# Patient Record
Sex: Female | Born: 2000 | Race: Black or African American | Hispanic: No | Marital: Single | Smoking: Former smoker
Health system: Southern US, Community
[De-identification: ages and names within clinical notes are randomized; demographics above are authoritative.]

## PROBLEM LIST (undated history)

## (undated) DIAGNOSIS — I4581 Long QT syndrome: Secondary | ICD-10-CM

---
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
## 2022-01-09 ENCOUNTER — Ambulatory Visit: Payer: Medicaid Other

## 2022-03-20 ENCOUNTER — Ambulatory Visit: Payer: Medicaid Other

## 2022-03-22 ENCOUNTER — Ambulatory Visit: Payer: Medicaid Other

## 2022-06-05 ENCOUNTER — Other Ambulatory Visit: Payer: Self-pay

## 2022-06-05 ENCOUNTER — Encounter: Payer: Self-pay | Admitting: *Deleted

## 2022-06-05 ENCOUNTER — Emergency Department
Admission: EM | Admit: 2022-06-05 | Discharge: 2022-06-05 | Disposition: A | Discharge status: Home / Self Care | Payer: BC Managed Care – PPO | Attending: Emergency Medicine | Admitting: Emergency Medicine

## 2022-06-05 DIAGNOSIS — R001 Bradycardia, unspecified: Secondary | ICD-10-CM | POA: Insufficient documentation

## 2022-06-05 DIAGNOSIS — R519 Headache, unspecified: Secondary | ICD-10-CM | POA: Insufficient documentation

## 2022-06-05 DIAGNOSIS — Y92410 Unspecified street and highway as the place of occurrence of the external cause: Secondary | ICD-10-CM | POA: Insufficient documentation

## 2022-06-05 DIAGNOSIS — Z950 Presence of cardiac pacemaker: Secondary | ICD-10-CM | POA: Insufficient documentation

## 2022-06-05 MED ORDER — BUTALBITAL-APAP-CAFFEINE 50-325-40 MG PO TABS
2.0000 | ORAL_TABLET | Freq: Once | ORAL | Status: AC
  Administered 2022-06-05: 2 via ORAL
  Filled 2022-06-05: qty 2
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
## 2022-07-18 ENCOUNTER — Ambulatory Visit: Payer: Medicaid Other

## 2023-04-18 ENCOUNTER — Other Ambulatory Visit: Payer: Self-pay

## 2023-04-18 ENCOUNTER — Emergency Department (HOSPITAL_COMMUNITY)
Admission: EM | Admit: 2023-04-18 | Discharge: 2023-04-18 | Disposition: A | Discharge status: Home / Self Care | Payer: Medicaid Other | Attending: Emergency Medicine | Admitting: Emergency Medicine

## 2023-04-18 ENCOUNTER — Encounter (HOSPITAL_COMMUNITY): Payer: Self-pay

## 2023-04-18 DIAGNOSIS — K029 Dental caries, unspecified: Secondary | ICD-10-CM | POA: Diagnosis not present

## 2023-04-18 DIAGNOSIS — Z950 Presence of cardiac pacemaker: Secondary | ICD-10-CM | POA: Insufficient documentation

## 2023-04-18 DIAGNOSIS — K0889 Other specified disorders of teeth and supporting structures: Secondary | ICD-10-CM | POA: Diagnosis present

## 2023-04-18 MED ORDER — OXYCODONE-ACETAMINOPHEN 5-325 MG PO TABS: 1.0000 | ORAL_TABLET | Freq: Four times a day (QID) | ORAL | 0 refills | Status: DC | PRN

## 2023-04-18 NOTE — ED Triage Notes (Signed)
Pt to ED with complaint of right upper dental pain x1 wk. Denies fevers.

## 2023-04-18 NOTE — Discharge Instructions (Signed)
You were seen in the emergency department for dental pain.  Based on my examination, it appears that you have dental caries that are causing her pain.  I would encourage you to follow-up with a dentist for further evaluation and treatment for this.  I have sent a prescription for some pain medication he can take for more severe breakthrough pain.  Otherwise I advised that he take Tylenol, ibuprofen, Aleve.  You feel your condition is worsening or begin to experience any notable swelling or redness around please return to the emergency department for further evaluation.

## 2023-04-18 NOTE — ED Provider Notes (Signed)
EMERGENCY DEPARTMENT AT [facility] Provider Note   CSN: [id number] Arrival date & time: 04/18/23  1819     History Chief Complaint  Patient presents with   Dental Pain    Marcia Carpenter is a 22 y.o. female.  Patient presents emergency department complaints of dental pain.  She reports that she has been experiencing right lower side dental pain.  Has not been seen by dentist recently.  Reports that she has a pacemaker and this makes it difficult to be seen by dentist for treatment.  Denies any difficulty eating or swallowing due to pain.   Dental Pain      Home Medications Prior to Admission medications   Medication Sig Start Date End Date Taking? Authorizing Provider  oxyCODONE-acetaminophen (PERCOCET/ROXICET) 5-325 MG tablet Take 1 tablet by mouth every 6 (six) hours as needed for severe pain. 04/18/23  Yes Smitty Knudsen, PA-C  clindamycin-benzoyl peroxide (BENZACLIN) gel Apply topically daily. Patient not taking: Reported on 04/23/2021 04/03/21   [provider]  DEPO-PROVERA 150 MG/ML injection Inject 150 mg into the muscle every 3 (three) months. Patient not taking: Reported on 04/23/2021 03/28/21   [provider]  Multiple Vitamins-Minerals (ONE DAILY FOR WOMEN) TABS Take 1 tablet by mouth daily. Patient not taking: Reported on 04/23/2021 03/28/21   [provider]  nadolol (CORGARD) 80 MG tablet Take 80 mg by mouth daily.    [provider]      Allergies    Other    Review of Systems   Review of Systems  HENT:  Positive for dental problem.   All other systems reviewed and are negative.   Physical Exam Updated Vital Signs BP 111/88 (BP Location: Right Arm)   Pulse 82   Temp 98.3 F (36.8 C) (Oral)   Resp 16   LMP 04/03/2023 (Approximate)   SpO2 100%  Physical Exam Vitals and nursing note reviewed.  HENT:     Head: Normocephalic and atraumatic.     Mouth/Throat:     Mouth: Mucous membranes are moist.      Comments: Dental caries noted on 31. No abscess. Eyes:     General: No scleral icterus.       Right eye: No discharge.        Left eye: No discharge.  Cardiovascular:     Rate and Rhythm: Normal rate and regular rhythm.  Skin:    Findings: No rash.     ED Results / Procedures / Treatments   Labs (all labs ordered are listed, but only abnormal results are displayed) Labs Reviewed - No data to display  EKG None  Radiology No results found.  Procedures Procedures   Medications Ordered in ED Medications - No data to display  ED Course/ Medical Decision Making/ A&P                           Medical Decision Making  Problem List / ED Course:  Patient presents emergency department complaints of dental pain.  She reports that this pain has been present for several days and has been worsening.  Reports that she has a pacemaker and has not been able to be seen by a dentist because of difficulty getting seen by dentist will that we will treat her with a pacemaker in place.  States that she has been trying over-the-counter pain medications without significant relief in symptoms.  On examination, patient does appear to  have dental caries and lower molars numbers 31 and possibly 18.  Advised patient that she needs to be seen by dentist for further evaluation and definitive treatment.  Will send a short course of pain medication to patient's pharmacy.  Patient is agreeable to treatment plan verbalized understanding all return precautions.  All questions answered prior to patient discharge.  Final Clinical Impression(s) / ED Diagnoses Final diagnoses:  Dental caries    Rx / DC Orders ED Discharge Orders          Ordered    oxyCODONE-acetaminophen (PERCOCET/ROXICET) 5-325 MG tablet  Every 6 hours PRN        04/18/23 1915              Salomon Mast 04/18/23 1916    Jacalyn Lefevre, MD 04/18/23 2226

## 2023-04-25 ENCOUNTER — Ambulatory Visit (LOCAL_COMMUNITY_HEALTH_CENTER): Payer: Medicaid Other | Admitting: Advanced Practice Midwife

## 2023-04-25 ENCOUNTER — Encounter: Payer: Self-pay | Admitting: Advanced Practice Midwife

## 2023-04-25 VITALS — BP 115/68 | HR 70 | Ht 72.0 in | Wt 171.8 lb

## 2023-04-25 DIAGNOSIS — Z01419 Encounter for gynecological examination (general) (routine) without abnormal findings: Secondary | ICD-10-CM | POA: Diagnosis not present

## 2023-04-25 DIAGNOSIS — Z3202 Encounter for pregnancy test, result negative: Secondary | ICD-10-CM | POA: Diagnosis not present

## 2023-04-25 DIAGNOSIS — Z309 Encounter for contraceptive management, unspecified: Secondary | ICD-10-CM

## 2023-04-25 DIAGNOSIS — Z3009 Encounter for other general counseling and advice on contraception: Secondary | ICD-10-CM

## 2023-04-25 DIAGNOSIS — I4581 Long QT syndrome: Secondary | ICD-10-CM | POA: Insufficient documentation

## 2023-04-25 LAB — WET PREP FOR TRICH, YEAST, CLUE: Trichomonas Exam: NEGATIVE

## 2023-04-25 LAB — PREGNANCY, URINE: Preg Test, Ur: NEGATIVE

## 2023-04-25 MED ORDER — LEVONORGESTREL 1.5 MG PO TABS
1.5000 mg | ORAL_TABLET | Freq: Once | ORAL | Status: AC
Start: 2023-04-25 — End: 2023-04-25
  Administered 2023-04-25: 1.5 mg via ORAL

## 2023-04-25 NOTE — Progress Notes (Signed)
Pt appointment for PE and STI screening. Seen by Lesia Sago. Family planning packet given and contents reviewed. Wet prep results reviewed with patient. Pregnancy test negative. Pt given Plan B and information sheet on it.

## 2023-04-25 NOTE — Progress Notes (Signed)
[facility] DEPARTMENT [facility] [address] Main Number: [phone]    Family Planning Visit- Initial Visit  Subjective:  Marcia Carpenter is a 22 y.o. SBF exsmoker [id number]   being seen today for an initial annual visit and to discuss reproductive life planning.  The patient is currently using No Method - Other Reason for pregnancy prevention. Patient reports   does not want a pregnancy in the next year.     report they are looking for a method that provides Other doesn't want any birth control  Patient has the following medical conditions has Long Q-T syndrome dx'd4/2017 on their problem list.  Chief Complaint  Patient presents with   Gynecologic Exam    PE, STI screening    Patient reports here for physical only. LMP 04/03/23. Last sex 04/23/23 without condom; with current partner x 3 mo; 1 partner in last 3 mol  last vaped 2020. Last cigar 03/2023. Last MJ 2022. Last ETOH 04/20/23 (8 shots Tequila) 1x/wk. Last dental exam 03/2023. Working 35 hrs/wk at [employer] and living with a female friend.   Patient denies cigs  Body mass index is 23.3 kg/m. - Patient is eligible for diabetes screening based on BMI> 25 and age >35?  not applicable HA1C ordered? not applicable  Patient reports 3  partner/s in last year. Desires STI screening?  Yes  Has patient been screened once for HCV in the past?  No  No results found for: "HCVAB"  Does the patient have current drug use (including MJ), have a partner with drug use, and/or has been incarcerated since last result? Yes  If yes-- Screen for HCV through [facility]   Does the patient meet criteria for HBV testing? No  Criteria:  -Household, sexual or needle sharing contact with HBV -History of drug use -HIV positive -Those with known Hep C   Health Maintenance Due  Topic Date Due   COVID-19 Vaccine (1) Never done   CHLAMYDIA SCREENING  Never done   HIV Screening  Never done   Hepatitis C  Screening  Never done    Review of Systems  Constitutional:  Positive for weight loss (up and down with anorexia 01/2023 x1 month).  Neurological:  Positive for headaches (qo week right temple relieved with BC powder,-N&V, -audio,-vision).  All other systems reviewed and are negative.   The following portions of the patient's history were reviewed and updated as appropriate: allergies, current medications, past family history, past medical history, past social history, past surgical history and problem list. Problem list updated.   See flowsheet for other program required questions.  Objective:   Vitals:   04/25/23 1525  BP: 115/68  Pulse: 70  Weight: 171 lb 12.8 oz (77.9 kg)  Height: 6' (1.829 m)    Physical Exam Constitutional:      Appearance: Normal appearance. She is normal weight.  HENT:     Head: Normocephalic and atraumatic.     Mouth/Throat:     Mouth: Mucous membranes are moist.  Eyes:     Conjunctiva/sclera: Conjunctivae normal.  Neck:     Thyroid: No thyroid mass, thyromegaly or thyroid tenderness.  Cardiovascular:     Rate and Rhythm: Normal rate and regular rhythm.  Pulmonary:     Effort: Pulmonary effort is normal.     Breath sounds: Normal breath sounds.  Abdominal:     General: Abdomen is flat.     Palpations: Abdomen is soft.  Comments: Soft without masses or tenderness, good tone  Genitourinary:    General: Normal vulva.     Exam position: Lithotomy position.     Vagina: Vaginal discharge (large amt creamy white leukorrhea, ph<4.5) present.     Cervix: Normal.     Uterus: Normal.      Adnexa: Right adnexa normal and left adnexa normal.     Rectum: Normal.  Musculoskeletal:        General: Normal range of motion.     Cervical back: Normal range of motion and neck supple.  Skin:    General: Skin is warm and dry.  Neurological:     Mental Status: She is alert.  Psychiatric:        Mood and Affect: Mood normal.       Assessment and  Plan:  Escarleth Gowell is a 21 y.o. female presenting to the [facility] Department for an initial annual wellness/contraceptive visit  Contraception counseling: Reviewed options based on patient desire and reproductive life plan. Patient is interested in No Method - Other Reason. This was provided to the patient today.  if not why not clearly documented  Risks, benefits, and typical effectiveness rates were reviewed.  Questions were answered.  Written information was also given to the patient to review.    The patient will follow up in  1 years for surveillance.  The patient was told to call with any further questions, or with any concerns about this method of contraception.  Emphasized use of condoms 100% of the time for STI prevention.  Need for ECP was assessed. Patient reported Unprotected sex within past 72 hours.  Reviewed options and patient desired Plan B (levonorgestrel)   1. Family planning Treat wet mount per standing orders Immunization nurse consult Pt desires Plan B today if PT neg today Pt refusing birth control but doesn't want a pregnancy  - WET PREP FOR TRICH, YEAST, CLUE - Pregnancy, urine - Chlamydia/Gonorrhea [facility] - Syphilis Serology, [facility] - HIV/HCV [facility] - levonorgestrel (PLAN B 1-STEP) tablet 1.5 mg  2. Well woman exam with routine gynecological exam Refusing birth control  3. Long Q-T syndrome Pacemaker 2017     No follow-ups on file.  No future appointments.  Alberteen Spindle, CNM

## 2023-10-02 ENCOUNTER — Ambulatory Visit: Payer: Medicaid Other

## 2023-10-25 ENCOUNTER — Ambulatory Visit: Payer: Medicaid Other

## 2024-01-31 ENCOUNTER — Ambulatory Visit: Payer: Medicaid Other

## 2024-02-24 ENCOUNTER — Ambulatory Visit

## 2024-08-19 ENCOUNTER — Ambulatory Visit

## 2024-08-19 DIAGNOSIS — R21 Rash and other nonspecific skin eruption: Secondary | ICD-10-CM

## 2024-08-19 DIAGNOSIS — Z113 Encounter for screening for infections with a predominantly sexual mode of transmission: Secondary | ICD-10-CM

## 2024-08-19 LAB — WET PREP FOR TRICH, YEAST, CLUE
Clue Cell Exam: NEGATIVE
Trichomonas Exam: NEGATIVE
Yeast Exam: NEGATIVE

## 2024-08-19 LAB — HM HIV SCREENING LAB: HM HIV Screening: NEGATIVE

## 2024-08-19 NOTE — Progress Notes (Signed)
 Pt is here for STD visit. Wet prep results reviewed with patient and requires no treatment per provider. Condoms declined. Wilkie Drought, RN.

## 2024-08-19 NOTE — Progress Notes (Signed)
 [facility] Department STI clinic [address] [place] [address] Main phone: [phone]  STI screening visit  Subjective:  Marcia Carpenter is a 23 y.o. female being seen today for an STI screening visit. The patient reports they do have symptoms: rash on the back of her thighs.  Patient reports that they do not desire a pregnancy in the next year.   They reported they are not interested in discussing contraception today.    Patient's last menstrual period was 08/06/2024 (exact date).  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 Concerned about some bumps on the back of her thighs that are itchy, come and go. Rash has been there for about 1 month. No drainage, not painful, not worsening over time. No new soaps, detergents or exposures. Not currently sexually active, no sex since June. Hx long QT syndrome.  Does the patient using douching products? No  See flowsheet for further details and programmatic requirements Hyperlink available at the top of the signed note in blue.  Flow sheet content below:  Pregnancy Intention Screening Does the patient want to become pregnant in the next year?: No Does the patient's partner want to become pregnant in the next year?: No Would the patient like to discuss contraceptive options today?: No Reason For STD Screen STD Screening: Is asymptomatic Have you ever had an STD?: Yes History of Antibiotic use in the past 2 weeks?: No STD Symptoms Denies all: No Genital Itching: No Lower abdominal pain: No Discharge: No Dysuria: No Genital ulcer / lesion: No Rash: Yes Vaginal irritation: No Oral / Other skin ulcer: No Pain with sex: No Sore Throat: No Visual Changes: No Vaginal Bleeding:  No Other (Describe in Comments): No Risk Factors for Hep B Household, sexual, or needle sharing contact of a person infected with Hep B: No Sexual contact with a person who uses drugs not as prescribed?: No Currently or Ever used drugs not as prescribed: No HIV Positive: No PRep Patient: No Men who have sex with men: No Have Hepatitis C: No History of Incarceration: No History of Homeslessness?: No Anal sex following anal drug use?: No Risk Factors for Hep C Currently using drugs not as prescribed: No Sexual partner(s) currently using drugs as not prescribed: No History of drug use: No HIV Positive: No People with a history of incarceration: No People born between the years of 4 and 21: No Abuse History Has patient ever been abused physically?: No Has patient ever been abused sexually?: No Does patient feel they have a problem with Anxiety?: No Does patient feel they have a problem with Depression?: No Counseling Patient counseled to use condoms with all sex: Condoms declined RTC in 2-3 weeks for test results: Yes Clinic will call if test results abnormal before test result appt.: Yes Test results given to patient Patient counseled to use condoms with all sex: Condoms declined   Screening for MPX risk: Does the patient have an unexplained rash? No Is the patient MSM? No Does the patient endorse multiple sex partners or anonymous sex partners? No Did the patient have close or sexual contact with a person diagnosed with MPX? No Has the patient traveled outside the [place]  where MPX is endemic? No Is there a high clinical suspicion for  MPX-- evidenced by one of the following No  -Unlikely to be chickenpox  -Lymphadenopathy  -Rash that present in same phase of evolution on any given body part  Screenings: Last HIV test per patient/review of record was No results found for: HMHIVSCREEN No results found for: HIV   Last HEPC test per patient/review of record was No results  found for: HMHEPCSCREEN No components found for: HEPC   Last HEPB test per patient/review of record was No components found for: HMHEPBSCREEN   Patient reports last pap was:   No results found for: SPECADGYN No Cervical Cancer Screening results to display.  Immunization history:  Immunization History  Administered Date(s) Administered   HPV 9-valent 09/07/2015, 10/02/2016   Hepatitis A 11/21/2005, 04/08/2007   Hepatitis B 11/27/2003, 01/28/2004, 12/28/2004   Tdap 08/30/2014    The following portions of the patient's history were reviewed and updated as appropriate: allergies, current medications, past medical history, past social history, past surgical history and problem list.  Objective:  There were no vitals filed for this visit.  Physical Exam Constitutional:      Appearance: Normal appearance.  HENT:     Head: Normocephalic.     Mouth/Throat:     Mouth: Mucous membranes are moist.     Pharynx: Oropharynx is clear. No pharyngeal swelling, oropharyngeal exudate or posterior oropharyngeal erythema.  Eyes:     General: No scleral icterus.       Right eye: No discharge.        Left eye: No discharge.  Pulmonary:     Effort: Pulmonary effort is normal.  Chest:     Comments: Has pacemaker Lymphadenopathy:     Cervical: No cervical adenopathy.  Skin:    General: Skin is warm and dry.     Findings: No rash.  Neurological:     Mental Status: She is alert.  Psychiatric:        Mood and Affect: Mood normal.        Behavior: Behavior normal.    Assessment and Plan:  Marcia Carpenter is a 23 y.o. female presenting to the [facility] Department for STI screening  1. Screening for venereal disease (Primary)  - Chlamydia/Gonorrhea [facility] - WET PREP FOR TRICH, YEAST, CLUE - HIV [facility] - Syphilis Serology, [facility]   2. Skin rash  - Patient concerned about rash on back of thighs - however, when assessed no rash observed. Skin a bit dry, but  no bumps or lesions. Patient states, It must have gone away.  - Discussed moisturizing the area with a scent free moisturizer.  Patient accepted the following screenings: vaginal CT/GC swab, vaginal wet prep, HIV, and RPR Patient meets criteria for HepB screening? No. Ordered? no Patient meets criteria for HepC screening? No. Ordered? no  Treat wet prep per standing order Discussed time line for [facility] results and that patient will be called with positive results and encouraged patient to call if she had not heard in 2 weeks.  Counseled to return or seek care for continued or worsening symptoms Recommended repeat testing in 3 months with positive results. Recommended condom use with all sex for STI prevention.   Patient is currently using abstinence to prevent pregnancy.    Return if symptoms worsen or fail to improve.  No future appointments.  Damien FORBES Satchel, NP

## 2024-09-03 ENCOUNTER — Telehealth: Payer: Self-pay | Admitting: Family Medicine

## 2024-09-11 ENCOUNTER — Telehealth: Payer: Self-pay

## 2024-09-11 ENCOUNTER — Telehealth: Payer: Self-pay | Admitting: Family Medicine

## 2024-09-11 NOTE — Telephone Encounter (Signed)
LVM for pt to call back for lab results

## 2024-09-11 NOTE — Telephone Encounter (Signed)
 Pt has been called and counseled about lab results

## 2024-10-09 ENCOUNTER — Ambulatory Visit

## 2024-11-17 ENCOUNTER — Ambulatory Visit (LOCAL_COMMUNITY_HEALTH_CENTER)

## 2024-11-17 DIAGNOSIS — Z111 Encounter for screening for respiratory tuberculosis: Secondary | ICD-10-CM

## 2024-11-19 ENCOUNTER — Ambulatory Visit

## 2024-11-19 DIAGNOSIS — Z111 Encounter for screening for respiratory tuberculosis: Secondary | ICD-10-CM

## 2024-11-19 LAB — TB SKIN TEST
Induration: 0 mm
TB Skin Test: NEGATIVE

## 2024-11-20 ENCOUNTER — Ambulatory Visit

## 2024-12-28 ENCOUNTER — Ambulatory Visit
